# Patient Record
Sex: Male | Born: 1975 | Race: White | Hispanic: No | Marital: Single | Smoking: Current every day smoker
Health system: Southern US, Community
[De-identification: ages and names within clinical notes are randomized; demographics above are authoritative.]

## PROBLEM LIST (undated history)

## (undated) DIAGNOSIS — G40909 Epilepsy, unspecified, not intractable, without status epilepticus: Secondary | ICD-10-CM

---

## 2017-07-11 ENCOUNTER — Inpatient Hospital Stay (HOSPITAL_COMMUNITY): Payer: Medicare Other | Admitting: Certified Registered Nurse Anesthetist

## 2017-07-11 ENCOUNTER — Encounter (HOSPITAL_COMMUNITY): Admission: EM | Disposition: A | Discharge status: Home Health | Payer: Self-pay | Source: Home / Self Care

## 2017-07-11 ENCOUNTER — Inpatient Hospital Stay (HOSPITAL_COMMUNITY)
Admission: EM | Admit: 2017-07-11 | Discharge: 2017-07-18 | DRG: 493 | Disposition: A | Discharge status: Home Health | Payer: Medicare Other | Attending: Student | Admitting: Student

## 2017-07-11 ENCOUNTER — Emergency Department (HOSPITAL_COMMUNITY): Payer: Medicare Other

## 2017-07-11 ENCOUNTER — Encounter (HOSPITAL_COMMUNITY): Payer: Self-pay | Admitting: Radiology

## 2017-07-11 ENCOUNTER — Other Ambulatory Visit: Payer: Self-pay | Admitting: Orthopedic Surgery

## 2017-07-11 DIAGNOSIS — S82141A Displaced bicondylar fracture of right tibia, initial encounter for closed fracture: Secondary | ICD-10-CM | POA: Diagnosis present

## 2017-07-11 DIAGNOSIS — R509 Fever, unspecified: Secondary | ICD-10-CM | POA: Diagnosis present

## 2017-07-11 DIAGNOSIS — N179 Acute kidney failure, unspecified: Secondary | ICD-10-CM | POA: Diagnosis present

## 2017-07-11 DIAGNOSIS — Y92410 Unspecified street and highway as the place of occurrence of the external cause: Secondary | ICD-10-CM

## 2017-07-11 DIAGNOSIS — G40909 Epilepsy, unspecified, not intractable, without status epilepticus: Secondary | ICD-10-CM | POA: Diagnosis present

## 2017-07-11 DIAGNOSIS — S93402A Sprain of unspecified ligament of left ankle, initial encounter: Secondary | ICD-10-CM | POA: Diagnosis present

## 2017-07-11 DIAGNOSIS — T148XXA Other injury of unspecified body region, initial encounter: Secondary | ICD-10-CM

## 2017-07-11 DIAGNOSIS — T426X6A Underdosing of other antiepileptic and sedative-hypnotic drugs, initial encounter: Secondary | ICD-10-CM | POA: Diagnosis present

## 2017-07-11 DIAGNOSIS — F17210 Nicotine dependence, cigarettes, uncomplicated: Secondary | ICD-10-CM | POA: Diagnosis present

## 2017-07-11 DIAGNOSIS — S82891C Other fracture of right lower leg, initial encounter for open fracture type IIIA, IIIB, or IIIC: Secondary | ICD-10-CM

## 2017-07-11 DIAGNOSIS — I959 Hypotension, unspecified: Secondary | ICD-10-CM | POA: Diagnosis present

## 2017-07-11 DIAGNOSIS — S93431A Sprain of tibiofibular ligament of right ankle, initial encounter: Secondary | ICD-10-CM | POA: Diagnosis present

## 2017-07-11 DIAGNOSIS — E872 Acidosis: Secondary | ICD-10-CM | POA: Diagnosis present

## 2017-07-11 DIAGNOSIS — S82142A Displaced bicondylar fracture of left tibia, initial encounter for closed fracture: Secondary | ICD-10-CM

## 2017-07-11 DIAGNOSIS — S9304XA Dislocation of right ankle joint, initial encounter: Secondary | ICD-10-CM | POA: Diagnosis present

## 2017-07-11 DIAGNOSIS — R569 Unspecified convulsions: Secondary | ICD-10-CM

## 2017-07-11 DIAGNOSIS — S52612A Displaced fracture of left ulna styloid process, initial encounter for closed fracture: Secondary | ICD-10-CM | POA: Diagnosis present

## 2017-07-11 DIAGNOSIS — S91011A Laceration without foreign body, right ankle, initial encounter: Secondary | ICD-10-CM | POA: Diagnosis present

## 2017-07-11 DIAGNOSIS — S92101A Unspecified fracture of right talus, initial encounter for closed fracture: Secondary | ICD-10-CM | POA: Diagnosis present

## 2017-07-11 HISTORY — PX: EXTERNAL FIXATION LEG: SHX1549

## 2017-07-11 HISTORY — PX: I & D EXTREMITY: SHX5045

## 2017-07-11 HISTORY — DX: Epilepsy, unspecified, not intractable, without status epilepticus: G40.909

## 2017-07-11 LAB — CBC
HCT: 43.2 % (ref 39.0–52.0)
HEMOGLOBIN: 14.6 g/dL (ref 13.0–17.0)
MCH: 29.9 pg (ref 26.0–34.0)
MCHC: 33.8 g/dL (ref 30.0–36.0)
MCV: 88.3 fL (ref 78.0–100.0)
PLATELETS: 413 10*3/uL — AB (ref 150–400)
RBC: 4.89 MIL/uL (ref 4.22–5.81)
RDW: 12.7 % (ref 11.5–15.5)
WBC: 18.4 10*3/uL — AB (ref 4.0–10.5)

## 2017-07-11 LAB — PREPARE FRESH FROZEN PLASMA: Unit division: 0

## 2017-07-11 LAB — BPAM FFP
BLOOD PRODUCT EXPIRATION DATE: 201906222359
Blood Product Expiration Date: 201906222359
ISSUE DATE / TIME: 201906191500
ISSUE DATE / TIME: 201906191500
UNIT TYPE AND RH: 6200
Unit Type and Rh: 6200

## 2017-07-11 LAB — I-STAT CHEM 8, ED
BUN: 8 mg/dL (ref 6–20)
CALCIUM ION: 1.12 mmol/L — AB (ref 1.15–1.40)
CREATININE: 1.2 mg/dL (ref 0.61–1.24)
Chloride: 106 mmol/L (ref 101–111)
GLUCOSE: 133 mg/dL — AB (ref 65–99)
HCT: 42 % (ref 39.0–52.0)
HEMOGLOBIN: 14.3 g/dL (ref 13.0–17.0)
Potassium: 3.5 mmol/L (ref 3.5–5.1)
Sodium: 139 mmol/L (ref 135–145)
TCO2: 18 mmol/L — ABNORMAL LOW (ref 22–32)

## 2017-07-11 LAB — PROTIME-INR
INR: 0.97
PROTHROMBIN TIME: 12.8 s (ref 11.4–15.2)

## 2017-07-11 LAB — COMPREHENSIVE METABOLIC PANEL
ALT: 25 U/L (ref 17–63)
ANION GAP: 14 (ref 5–15)
AST: 27 U/L (ref 15–41)
Albumin: 3.7 g/dL (ref 3.5–5.0)
Alkaline Phosphatase: 67 U/L (ref 38–126)
BUN: 9 mg/dL (ref 6–20)
CALCIUM: 8.7 mg/dL — AB (ref 8.9–10.3)
CHLORIDE: 107 mmol/L (ref 101–111)
CO2: 17 mmol/L — AB (ref 22–32)
Creatinine, Ser: 1.37 mg/dL — ABNORMAL HIGH (ref 0.61–1.24)
Glucose, Bld: 138 mg/dL — ABNORMAL HIGH (ref 65–99)
Potassium: 3.3 mmol/L — ABNORMAL LOW (ref 3.5–5.1)
SODIUM: 138 mmol/L (ref 135–145)
Total Bilirubin: 0.6 mg/dL (ref 0.3–1.2)
Total Protein: 6.1 g/dL — ABNORMAL LOW (ref 6.5–8.1)

## 2017-07-11 LAB — ETHANOL: Alcohol, Ethyl (B): <10 mg/dL (ref <10)

## 2017-07-11 LAB — ABO/RH: ABO/RH(D): O NEG

## 2017-07-11 LAB — I-STAT CG4 LACTIC ACID, ED: Lactic Acid, Venous: 8.8 mmol/L (ref 0.5–1.9)

## 2017-07-11 LAB — CDS SEROLOGY

## 2017-07-11 LAB — CBG MONITORING, ED: GLUCOSE-CAPILLARY: 139 mg/dL — AB (ref 65–99)

## 2017-07-11 SURGERY — IRRIGATION AND DEBRIDEMENT EXTREMITY
Anesthesia: General | Site: Ankle | Laterality: Right

## 2017-07-11 MED ORDER — LIDOCAINE 2% (20 MG/ML) 5 ML SYRINGE
INTRAMUSCULAR | Status: AC
  Filled 2017-07-11: qty 10

## 2017-07-11 MED ORDER — PROPOFOL 10 MG/ML IV BOLUS
INTRAVENOUS | Status: DC | PRN
  Administered 2017-07-11: 170 mg via INTRAVENOUS
  Administered 2017-07-11: 30 mg via INTRAVENOUS

## 2017-07-11 MED ORDER — OXYCODONE HCL 5 MG PO TABS: 5.0000 mg | ORAL_TABLET | ORAL | Status: DC | PRN

## 2017-07-11 MED ORDER — ACETAMINOPHEN 325 MG PO TABS: 325.0000 mg | ORAL_TABLET | Freq: Four times a day (QID) | ORAL | Status: DC | PRN

## 2017-07-11 MED ORDER — SUGAMMADEX SODIUM 200 MG/2ML IV SOLN
INTRAVENOUS | Status: DC | PRN
  Administered 2017-07-11: 200 mg via INTRAVENOUS

## 2017-07-11 MED ORDER — ROCURONIUM BROMIDE 50 MG/5ML IV SOLN
INTRAVENOUS | Status: AC
  Filled 2017-07-11: qty 2

## 2017-07-11 MED ORDER — FENTANYL CITRATE (PF) 250 MCG/5ML IJ SOLN
INTRAMUSCULAR | Status: AC
  Filled 2017-07-11: qty 5

## 2017-07-11 MED ORDER — PHENYLEPHRINE 40 MCG/ML (10ML) SYRINGE FOR IV PUSH (FOR BLOOD PRESSURE SUPPORT)
PREFILLED_SYRINGE | INTRAVENOUS | Status: AC
  Filled 2017-07-11: qty 10

## 2017-07-11 MED ORDER — IOPAMIDOL (ISOVUE-370) INJECTION 76%
100.0000 mL | Freq: Once | INTRAVENOUS | Status: AC | PRN
  Administered 2017-07-11: 100 mL via INTRAVENOUS

## 2017-07-11 MED ORDER — LEVETIRACETAM IN NACL 1500 MG/100ML IV SOLN
1500.0000 mg | Freq: Two times a day (BID) | INTRAVENOUS | Status: DC
  Administered 2017-07-12 – 2017-07-13 (×3): 1500 mg via INTRAVENOUS
  Filled 2017-07-11 (×3): qty 100

## 2017-07-11 MED ORDER — SODIUM CHLORIDE 0.9 % IR SOLN
Status: DC | PRN
  Administered 2017-07-11: 6000 mL

## 2017-07-11 MED ORDER — ONDANSETRON HCL 4 MG/2ML IJ SOLN
INTRAMUSCULAR | Status: DC | PRN
  Administered 2017-07-11: 4 mg via INTRAVENOUS

## 2017-07-11 MED ORDER — ONDANSETRON HCL 4 MG/2ML IJ SOLN
4.0000 mg | Freq: Four times a day (QID) | INTRAMUSCULAR | Status: DC | PRN
  Administered 2017-07-16: 4 mg via INTRAVENOUS
  Filled 2017-07-11: qty 2

## 2017-07-11 MED ORDER — HYDROMORPHONE HCL 1 MG/ML IJ SOLN
INTRAMUSCULAR | Status: AC
  Administered 2017-07-11: 23:00:00
  Filled 2017-07-11: qty 1

## 2017-07-11 MED ORDER — MIDAZOLAM HCL 2 MG/2ML IJ SOLN
INTRAMUSCULAR | Status: AC
  Filled 2017-07-11: qty 2

## 2017-07-11 MED ORDER — FENTANYL CITRATE (PF) 100 MCG/2ML IJ SOLN
INTRAMUSCULAR | Status: DC | PRN
  Administered 2017-07-11 (×2): 50 ug via INTRAVENOUS
  Administered 2017-07-11: 100 ug via INTRAVENOUS
  Administered 2017-07-11 (×4): 50 ug via INTRAVENOUS
  Administered 2017-07-11: 100 ug via INTRAVENOUS

## 2017-07-11 MED ORDER — KETAMINE HCL 10 MG/ML IJ SOLN
INTRAMUSCULAR | Status: AC
  Filled 2017-07-11: qty 1

## 2017-07-11 MED ORDER — ONDANSETRON HCL 4 MG/2ML IJ SOLN: 4.0000 mg | Freq: Four times a day (QID) | INTRAMUSCULAR | Status: DC | PRN

## 2017-07-11 MED ORDER — OXYCODONE HCL 5 MG PO TABS: 10.0000 mg | ORAL_TABLET | ORAL | Status: DC | PRN

## 2017-07-11 MED ORDER — LEVETIRACETAM IN NACL 1000 MG/100ML IV SOLN: 1000.0000 mg | Freq: Once | INTRAVENOUS | Status: DC

## 2017-07-11 MED ORDER — ONDANSETRON HCL 4 MG PO TABS: 4.0000 mg | ORAL_TABLET | Freq: Four times a day (QID) | ORAL | Status: DC | PRN

## 2017-07-11 MED ORDER — PHENYLEPHRINE 40 MCG/ML (10ML) SYRINGE FOR IV PUSH (FOR BLOOD PRESSURE SUPPORT)
PREFILLED_SYRINGE | INTRAVENOUS | Status: AC
  Filled 2017-07-11: qty 20

## 2017-07-11 MED ORDER — METHOCARBAMOL 1000 MG/10ML IJ SOLN: 500.0000 mg | Freq: Four times a day (QID) | INTRAVENOUS | Status: DC | PRN

## 2017-07-11 MED ORDER — ROCURONIUM BROMIDE 100 MG/10ML IV SOLN
INTRAVENOUS | Status: DC | PRN
  Administered 2017-07-11: 50 mg via INTRAVENOUS

## 2017-07-11 MED ORDER — FAMOTIDINE 20 MG PO TABS
20.0000 mg | ORAL_TABLET | Freq: Every day | ORAL | Status: DC
  Administered 2017-07-12 – 2017-07-18 (×6): 20 mg via ORAL
  Filled 2017-07-11 (×6): qty 1

## 2017-07-11 MED ORDER — DOCUSATE SODIUM 100 MG PO CAPS: 100.0000 mg | ORAL_CAPSULE | Freq: Two times a day (BID) | ORAL | Status: DC

## 2017-07-11 MED ORDER — LEVETIRACETAM IN NACL 1000 MG/100ML IV SOLN: 1000.0000 mg | INTRAVENOUS | Status: DC

## 2017-07-11 MED ORDER — METHOCARBAMOL 500 MG PO TABS
500.0000 mg | ORAL_TABLET | Freq: Four times a day (QID) | ORAL | Status: DC | PRN
  Administered 2017-07-12 – 2017-07-17 (×7): 500 mg via ORAL
  Filled 2017-07-11 (×10): qty 1

## 2017-07-11 MED ORDER — ACETAMINOPHEN 325 MG PO TABS: 650.0000 mg | ORAL_TABLET | Freq: Four times a day (QID) | ORAL | Status: DC

## 2017-07-11 MED ORDER — SUCCINYLCHOLINE CHLORIDE 20 MG/ML IJ SOLN
INTRAMUSCULAR | Status: DC | PRN
  Administered 2017-07-11: 120 mg via INTRAVENOUS

## 2017-07-11 MED ORDER — IOPAMIDOL (ISOVUE-370) INJECTION 76%
INTRAVENOUS | Status: AC
  Filled 2017-07-11: qty 100

## 2017-07-11 MED ORDER — DOCUSATE SODIUM 100 MG PO CAPS
100.0000 mg | ORAL_CAPSULE | Freq: Two times a day (BID) | ORAL | Status: DC
  Administered 2017-07-12 – 2017-07-18 (×12): 100 mg via ORAL
  Filled 2017-07-11 (×12): qty 1

## 2017-07-11 MED ORDER — KETAMINE HCL 10 MG/ML IJ SOLN
INTRAMUSCULAR | Status: AC | PRN
  Administered 2017-07-11: 90 mg via INTRAVENOUS

## 2017-07-11 MED ORDER — HYDROMORPHONE HCL 1 MG/ML IJ SOLN: 0.2500 mg | INTRAMUSCULAR | Status: DC | PRN

## 2017-07-11 MED ORDER — ONDANSETRON HCL 4 MG/2ML IJ SOLN: 4.0000 mg | Freq: Once | INTRAMUSCULAR | Status: DC | PRN

## 2017-07-11 MED ORDER — LAMOTRIGINE 150 MG PO TABS
300.0000 mg | ORAL_TABLET | Freq: Two times a day (BID) | ORAL | Status: DC
  Administered 2017-07-12 – 2017-07-18 (×12): 300 mg via ORAL
  Filled 2017-07-11 (×14): qty 2

## 2017-07-11 MED ORDER — CEFAZOLIN SODIUM-DEXTROSE 2-4 GM/100ML-% IV SOLN
2.0000 g | Freq: Three times a day (TID) | INTRAVENOUS | Status: DC
  Administered 2017-07-11 – 2017-07-16 (×15): 2 g via INTRAVENOUS
  Filled 2017-07-11 (×15): qty 100

## 2017-07-11 MED ORDER — MORPHINE SULFATE (PF) 4 MG/ML IV SOLN
2.0000 mg | INTRAVENOUS | Status: DC | PRN
  Administered 2017-07-13: 2 mg via INTRAVENOUS
  Administered 2017-07-16 – 2017-07-17 (×2): 4 mg via INTRAVENOUS
  Filled 2017-07-11 (×3): qty 1

## 2017-07-11 MED ORDER — ENOXAPARIN SODIUM 40 MG/0.4ML ~~LOC~~ SOLN
40.0000 mg | SUBCUTANEOUS | Status: DC
  Administered 2017-07-12 – 2017-07-18 (×6): 40 mg via SUBCUTANEOUS
  Filled 2017-07-11 (×6): qty 0.4

## 2017-07-11 MED ORDER — HYDRALAZINE HCL 20 MG/ML IJ SOLN: 10.0000 mg | INTRAMUSCULAR | Status: DC | PRN

## 2017-07-11 MED ORDER — LIDOCAINE HCL (CARDIAC) PF 100 MG/5ML IV SOSY
PREFILLED_SYRINGE | INTRAVENOUS | Status: DC | PRN
  Administered 2017-07-11: 100 mg via INTRAVENOUS

## 2017-07-11 MED ORDER — POTASSIUM CHLORIDE IN NACL 20-0.9 MEQ/L-% IV SOLN
INTRAVENOUS | Status: DC
  Filled 2017-07-11: qty 1000

## 2017-07-11 MED ORDER — SODIUM CHLORIDE 0.9 % IV SOLN
2000.0000 mg | INTRAVENOUS | Status: AC
  Administered 2017-07-11: 2000 mg via INTRAVENOUS
  Filled 2017-07-11 (×2): qty 20

## 2017-07-11 MED ORDER — ONDANSETRON 4 MG PO TBDP: 4.0000 mg | ORAL_TABLET | Freq: Four times a day (QID) | ORAL | Status: DC | PRN

## 2017-07-11 MED ORDER — DEXMEDETOMIDINE HCL 200 MCG/2ML IV SOLN
INTRAVENOUS | Status: DC | PRN
  Administered 2017-07-11: 20 ug via INTRAVENOUS

## 2017-07-11 MED ORDER — SODIUM CHLORIDE 0.9 % IV SOLN
2000.0000 mg | INTRAVENOUS | Status: AC
  Administered 2017-07-11: 2000 mg via INTRAVENOUS
  Filled 2017-07-11: qty 20

## 2017-07-11 MED ORDER — SENNA 8.6 MG PO TABS
1.0000 | ORAL_TABLET | Freq: Two times a day (BID) | ORAL | Status: DC
  Administered 2017-07-12 – 2017-07-18 (×13): 8.6 mg via ORAL
  Filled 2017-07-11 (×13): qty 1

## 2017-07-11 MED ORDER — MIDAZOLAM HCL 5 MG/5ML IJ SOLN
INTRAMUSCULAR | Status: DC | PRN
  Administered 2017-07-11 (×2): 1 mg via INTRAVENOUS

## 2017-07-11 MED ORDER — PROPOFOL 10 MG/ML IV BOLUS
INTRAVENOUS | Status: AC
  Filled 2017-07-11: qty 20

## 2017-07-11 MED ORDER — DEXAMETHASONE SODIUM PHOSPHATE 10 MG/ML IJ SOLN
INTRAMUSCULAR | Status: DC | PRN
  Administered 2017-07-11: 10 mg via INTRAVENOUS

## 2017-07-11 MED ORDER — PHENYLEPHRINE HCL 10 MG/ML IJ SOLN
INTRAMUSCULAR | Status: DC | PRN
  Administered 2017-07-11: 160 ug via INTRAVENOUS
  Administered 2017-07-11 (×2): 80 ug via INTRAVENOUS
  Administered 2017-07-11: 120 ug via INTRAVENOUS

## 2017-07-11 MED ORDER — LACTATED RINGERS IV SOLN
INTRAVENOUS | Status: DC | PRN
  Administered 2017-07-11 (×2): via INTRAVENOUS

## 2017-07-11 MED ORDER — 0.9 % SODIUM CHLORIDE (POUR BTL) OPTIME
TOPICAL | Status: DC | PRN
  Administered 2017-07-11: 1000 mL

## 2017-07-11 MED ORDER — OXYCODONE HCL 5 MG PO TABS
5.0000 mg | ORAL_TABLET | ORAL | Status: DC | PRN
  Administered 2017-07-12: 10 mg via ORAL
  Filled 2017-07-11: qty 2

## 2017-07-11 MED ORDER — DIPHENHYDRAMINE HCL 12.5 MG/5ML PO ELIX: 12.5000 mg | ORAL_SOLUTION | ORAL | Status: DC | PRN

## 2017-07-11 MED ORDER — SODIUM CHLORIDE 0.9 % IV BOLUS
3000.0000 mL | Freq: Once | INTRAVENOUS | Status: AC
  Administered 2017-07-11: 3000 mL via INTRAVENOUS

## 2017-07-11 MED ORDER — ACETAMINOPHEN 325 MG PO TABS: 650.0000 mg | ORAL_TABLET | ORAL | Status: DC | PRN

## 2017-07-11 MED ORDER — ONDANSETRON HCL 4 MG/2ML IJ SOLN
INTRAMUSCULAR | Status: AC
  Filled 2017-07-11: qty 6

## 2017-07-11 MED ORDER — SODIUM CHLORIDE 0.9 % IV SOLN
INTRAVENOUS | Status: DC
  Administered 2017-07-11 – 2017-07-12 (×2): via INTRAVENOUS

## 2017-07-11 MED ORDER — DEXAMETHASONE SODIUM PHOSPHATE 10 MG/ML IJ SOLN
INTRAMUSCULAR | Status: AC
  Filled 2017-07-11: qty 5

## 2017-07-11 SURGICAL SUPPLY — 75 items
ANKLE SYNDEMOSIS ZIPTIGHT (Ankle) ×3 IMPLANT
BANDAGE ACE 4X5 VEL STRL LF (GAUZE/BANDAGES/DRESSINGS) ×2 IMPLANT
BANDAGE ELASTIC 6 VELCRO ST LF (GAUZE/BANDAGES/DRESSINGS) ×2 IMPLANT
BANDAGE ESMARK 6X9 LF (GAUZE/BANDAGES/DRESSINGS) ×1 IMPLANT
BAR GLASS FIBER EXFX 11X500 (EXFIX) ×4 IMPLANT
BIT DRILL CANN MED FLUTE 4.0 (BIT) IMPLANT
BLADE SURG 10 STRL SS (BLADE) ×3 IMPLANT
BNDG CONFORM 3 STRL LF (GAUZE/BANDAGES/DRESSINGS) ×1 IMPLANT
BNDG ESMARK 6X9 LF (GAUZE/BANDAGES/DRESSINGS) ×3
BNDG GAUZE ELAST 4 BULKY (GAUZE/BANDAGES/DRESSINGS) ×4 IMPLANT
CANISTER SUCT 3000ML PPV (MISCELLANEOUS) ×3 IMPLANT
CHLORAPREP W/TINT 26ML (MISCELLANEOUS) ×1 IMPLANT
COVER SURGICAL LIGHT HANDLE (MISCELLANEOUS) ×3 IMPLANT
CUFF TOURNIQUET SINGLE 34IN LL (TOURNIQUET CUFF) ×3 IMPLANT
CUFF TOURNIQUET SINGLE 44IN (TOURNIQUET CUFF) IMPLANT
DEVICE FIXATION W/ZIPLOOP (Orthopedic Implant) ×2 IMPLANT
DRAPE C-ARM 42X72 X-RAY (DRAPES) ×1 IMPLANT
DRAPE U-SHAPE 47X51 STRL (DRAPES) ×3 IMPLANT
DRILL CANN 4.0MM (BIT) ×3
DRSG ADAPTIC 3X8 NADH LF (GAUZE/BANDAGES/DRESSINGS) IMPLANT
DRSG MEPILEX BORDER 4X8 (GAUZE/BANDAGES/DRESSINGS) ×2 IMPLANT
DRSG MEPITEL 4X7.2 (GAUZE/BANDAGES/DRESSINGS) ×1 IMPLANT
DRSG PAD ABDOMINAL 8X10 ST (GAUZE/BANDAGES/DRESSINGS) ×4 IMPLANT
ELECT REM PT RETURN 9FT ADLT (ELECTROSURGICAL) ×3
ELECTRODE REM PT RTRN 9FT ADLT (ELECTROSURGICAL) ×1 IMPLANT
EVACUATOR SILICONE 100CC (DRAIN) IMPLANT
GAUZE SPONGE 4X4 12PLY STRL (GAUZE/BANDAGES/DRESSINGS) IMPLANT
GLOVE BIO SURGEON STRL SZ8 (GLOVE) ×3 IMPLANT
GLOVE BIOGEL PI IND STRL 8 (GLOVE) ×2 IMPLANT
GLOVE BIOGEL PI INDICATOR 8 (GLOVE) ×4
GLOVE ECLIPSE 8.0 STRL XLNG CF (GLOVE) ×3 IMPLANT
GOWN STRL REUS W/ TWL LRG LVL3 (GOWN DISPOSABLE) ×1 IMPLANT
GOWN STRL REUS W/ TWL XL LVL3 (GOWN DISPOSABLE) ×2 IMPLANT
GOWN STRL REUS W/TWL LRG LVL3 (GOWN DISPOSABLE) ×2
GOWN STRL REUS W/TWL XL LVL3 (GOWN DISPOSABLE) ×4
HALF PIN 5.0X160 (EXFIX) ×4 IMPLANT
KIT BASIN OR (CUSTOM PROCEDURE TRAY) ×3 IMPLANT
KIT TURNOVER KIT B (KITS) ×3 IMPLANT
NEEDLE 22X1 1/2 (OR ONLY) (NEEDLE) IMPLANT
NS IRRIG 1000ML POUR BTL (IV SOLUTION) ×3 IMPLANT
PACK ORTHO EXTREMITY (CUSTOM PROCEDURE TRAY) ×3 IMPLANT
PAD ARMBOARD 7.5X6 YLW CONV (MISCELLANEOUS) ×6 IMPLANT
PAD CAST 4YDX4 CTTN HI CHSV (CAST SUPPLIES) ×1 IMPLANT
PADDING CAST COTTON 4X4 STRL (CAST SUPPLIES) ×2
PIN CLAMP 2BAR 75MM BLUE (EXFIX) ×4 IMPLANT
PIN HALF YELLOW 5X160X35 (EXFIX) ×4 IMPLANT
PLATE SMALL FRAG 3.5X49 4H (Plate) ×2 IMPLANT
SET CYSTO W/LG BORE CLAMP LF (SET/KITS/TRAYS/PACK) ×3 IMPLANT
SOAP 2 % CHG 4 OZ (WOUND CARE) ×3 IMPLANT
SPLINT PLASTER CAST XFAST 5X30 (CAST SUPPLIES) IMPLANT
SPLINT PLASTER XFAST SET 5X30 (CAST SUPPLIES) ×2
SPONGE LAP 18X18 X RAY DECT (DISPOSABLE) ×2 IMPLANT
SPONGE LAP 4X18 RFD (DISPOSABLE) ×3 IMPLANT
STAPLER VISISTAT 35W (STAPLE) IMPLANT
SUCTION FRAZIER HANDLE 10FR (MISCELLANEOUS) ×2
SUCTION TUBE FRAZIER 10FR DISP (MISCELLANEOUS) ×1 IMPLANT
SUT ETHILON 2 0 FS 18 (SUTURE) ×4 IMPLANT
SUT ETHILON 3 0 PS 1 (SUTURE) ×4 IMPLANT
SUT PDS AB 0 CT 36 (SUTURE) ×4 IMPLANT
SUT PROLENE 3 0 PS 2 (SUTURE) ×1 IMPLANT
SUT VIC AB 2-0 CT1 27 (SUTURE) ×4
SUT VIC AB 2-0 CT1 TAPERPNT 27 (SUTURE) IMPLANT
SUT VIC AB 3-0 PS2 18 (SUTURE)
SUT VIC AB 3-0 PS2 18XBRD (SUTURE) ×1 IMPLANT
SYR CONTROL 10ML LL (SYRINGE) IMPLANT
SYSTEM FIXATN ANKL SYNDESMOSIS (Ankle) IMPLANT
TOWEL OR 17X24 6PK STRL BLUE (TOWEL DISPOSABLE) ×3 IMPLANT
TOWEL OR 17X26 10 PK STRL BLUE (TOWEL DISPOSABLE) ×3 IMPLANT
TRAY FOL W/BAG SLVR 16FR STRL (SET/KITS/TRAYS/PACK) IMPLANT
TRAY FOLEY W/BAG SLVR 16FR LF (SET/KITS/TRAYS/PACK) ×2
TUBE CONNECTING 12'X1/4 (SUCTIONS) ×1
TUBE CONNECTING 12X1/4 (SUCTIONS) ×2 IMPLANT
TUBING CYSTO DISP (UROLOGICAL SUPPLIES) ×3 IMPLANT
WATER STERILE IRR 1000ML POUR (IV SOLUTION) ×3 IMPLANT
YANKAUER SUCT BULB TIP NO VENT (SUCTIONS) ×3 IMPLANT

## 2017-07-11 NOTE — ED Notes (Signed)
pts shirt was cut off by ems and discarded .  Shorts belt socks and one shoe  Was placed in a bag and given to the pts girlfirend with his brother at the bedside.  The pts wallet  Was givren to the pts girlfirend by dr Madilyn Hookrees  The brother is ware and ok with that

## 2017-07-11 NOTE — ED Notes (Signed)
Report given to Chris C RN

## 2017-07-11 NOTE — Anesthesia Preprocedure Evaluation (Addendum)
Anesthesia Evaluation  Patient identified by MRN, date of birth, ID band Patient awake    Reviewed: Allergy & Precautions, NPO status , Patient's Chart, lab work & pertinent test results  Airway Mallampati: III  TM Distance: >3 FB Neck ROM: Limited    Dental  (+) Dental Advisory Given   Pulmonary neg pulmonary ROS,    breath sounds clear to auscultation       Cardiovascular negative cardio ROS   Rhythm:Regular Rate:Normal     Neuro/Psych Seizures -,  C-collar precautions negative neurological ROS     GI/Hepatic negative GI ROS, Neg liver ROS,   Endo/Other  negative endocrine ROS  Renal/GU negative Renal ROS     Musculoskeletal Open right ankle fx   Abdominal   Peds  Hematology   Anesthesia Other Findings   Reproductive/Obstetrics                           Lab Results  Component Value Date   WBC 18.4 (H) 07/11/2017   HGB 14.3 07/11/2017   HCT 42.0 07/11/2017   MCV 88.3 07/11/2017   PLT 413 (H) 07/11/2017   Lab Results  Component Value Date   CREATININE 1.20 07/11/2017   BUN 8 07/11/2017   NA 139 07/11/2017   K 3.5 07/11/2017   CL 106 07/11/2017   CO2 17 (L) 07/11/2017    Anesthesia Physical Anesthesia Plan  ASA: III and emergent  Anesthesia Plan: General   Post-op Pain Management:    Induction: Intravenous and Rapid sequence  PONV Risk Score and Plan: 2 and Ondansetron, Dexamethasone and Treatment may vary due to age or medical condition  Airway Management Planned: Oral ETT and Video Laryngoscope Planned  Additional Equipment:   Intra-op Plan:   Post-operative Plan: Extubation in OR  Informed Consent: I have reviewed the patients History and Physical, chart, labs and discussed the procedure including the risks, benefits and alternatives for the proposed anesthesia with the patient or authorized representative who has indicated his/her understanding and acceptance.    Dental advisory given  Plan Discussed with:   Anesthesia Plan Comments:        Anesthesia Quick Evaluation

## 2017-07-11 NOTE — Anesthesia Procedure Notes (Signed)
Procedure Name: Intubation Date/Time: 07/11/2017 7:17 PM Performed by: Adonis Housekeeperongell, Kalisa Girtman M, CRNA Pre-anesthesia Checklist: Patient identified, Emergency Drugs available, Suction available and Patient being monitored Patient Re-evaluated:Patient Re-evaluated prior to induction Oxygen Delivery Method: Circle system utilized Preoxygenation: Pre-oxygenation with 100% oxygen Induction Type: IV induction, Rapid sequence and Cricoid Pressure applied Laryngoscope Size: Glidescope and 4 Grade View: Grade I Tube type: Oral Tube size: 7.5 mm Number of attempts: 1 Airway Equipment and Method: Rigid stylet and Video-laryngoscopy Placement Confirmation: ETT inserted through vocal cords under direct vision,  positive ETCO2 and breath sounds checked- equal and bilateral Secured at: 25 cm Tube secured with: Tape Dental Injury: Teeth and Oropharynx as per pre-operative assessment

## 2017-07-11 NOTE — ED Notes (Signed)
Preparing to emergently do conscious sedation to set right lower ankle.
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
## 2017-07-11 NOTE — Sedation Documentation (Signed)
Ankle reduced and positive pulses obtained.

## 2017-07-11 NOTE — ED Notes (Signed)
Pt went to c-t and just returned to the room.  Alert oriented skin warm and dry.  C/o pain in hnis rt leg still
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
## 2017-07-11 NOTE — ED Notes (Signed)
Family remains at the bedside.  Oral meds not given at present  Waiting on the ortho doctor for poss surgeyr

## 2017-07-11 NOTE — ED Notes (Signed)
keppra attempted but the attachment was not tight enough  Most of iv med emptied out  Replacement needed  Pharmacist will replace it

## 2017-07-11 NOTE — ED Notes (Signed)
Or permit signed by the pt   Ready for or

## 2017-07-11 NOTE — ED Notes (Signed)
Dr hewitt at the beside
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
## 2017-07-11 NOTE — Transfer of Care (Signed)
Immediate Anesthesia Transfer of Care Note  Patient: Chase Morris  Procedure(s) Performed: IRRIGATION AND DEBRIDEMENT OPEN FRACTURE RIGHT ANKLE OPEN REDUCTION INTERNAL FIXATION (Right Ankle) EXTERNAL FIXATION RIGHT TIBIAL PLATEAU (Right Ankle)  Patient Location: PACU  Anesthesia Type:General  Level of Consciousness: awake  - agitated Airway & Oxygen Therapy: Patient Spontanous Breathing and Patient connected to face mask oxygen  Post-op Assessment: Report given to RN and Post -op Vital signs reviewed and stable  Post vital signs: Reviewed and stable  Last Vitals:  Vitals Value Taken Time  BP 136/94 07/11/2017  9:37 PM  Temp    Pulse 102 07/11/2017  9:40 PM  Resp 17 07/11/2017  9:40 PM  SpO2 94 % 07/11/2017  9:40 PM  Vitals shown include unvalidated device data.  Last Pain:  Vitals:   07/11/17 1816  PainSc: 10-Worst pain ever         Complications: No apparent anesthesia complications

## 2017-07-11 NOTE — ED Notes (Signed)
Dr Fredricka Bonineconnor back to see  She does not know the name iof the ortho doctor seeing the pt

## 2017-07-11 NOTE — Progress Notes (Signed)
Patient groggy but arouses to voice.  Denies pain.  Oriented x 4.  I asked patient if it was okay to share information with his significant other, Margaretha GlassingLoretta.  He stated that it was okay to update her.  Interaction witnessed by Zeb ComfortSteve Shaw RN.

## 2017-07-11 NOTE — ED Provider Notes (Signed)
  [facility]   CSN: [id number] Arrival date & time: 07/11/17  1446         Jacalyn LefevreHaviland, Rionna Feltes, MD 07/11/17 1529
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
## 2017-07-12 ENCOUNTER — Inpatient Hospital Stay (HOSPITAL_COMMUNITY): Payer: Medicare Other

## 2017-07-12 ENCOUNTER — Other Ambulatory Visit: Payer: Self-pay

## 2017-07-12 ENCOUNTER — Encounter (HOSPITAL_COMMUNITY): Payer: Self-pay | Admitting: Student

## 2017-07-12 DIAGNOSIS — R569 Unspecified convulsions: Secondary | ICD-10-CM

## 2017-07-12 LAB — CBC
HCT: 40.6 % (ref 39.0–52.0)
Hemoglobin: 13.6 g/dL (ref 13.0–17.0)
MCH: 29.6 pg (ref 26.0–34.0)
MCHC: 33.5 g/dL (ref 30.0–36.0)
MCV: 88.5 fL (ref 78.0–100.0)
PLATELETS: 316 10*3/uL (ref 150–400)
RBC: 4.59 MIL/uL (ref 4.22–5.81)
RDW: 12.9 % (ref 11.5–15.5)
WBC: 18.4 10*3/uL — ABNORMAL HIGH (ref 4.0–10.5)

## 2017-07-12 LAB — URINALYSIS, ROUTINE W REFLEX MICROSCOPIC
BILIRUBIN URINE: NEGATIVE
Glucose, UA: NEGATIVE mg/dL
KETONES UR: NEGATIVE mg/dL
NITRITE: NEGATIVE
PH: 6 (ref 5.0–8.0)
Protein, ur: NEGATIVE mg/dL
Specific Gravity, Urine: 1.009 (ref 1.005–1.030)

## 2017-07-12 LAB — RAPID URINE DRUG SCREEN, HOSP PERFORMED
AMPHETAMINES: NOT DETECTED
Benzodiazepines: POSITIVE — AB
Cocaine: NOT DETECTED
Opiates: NOT DETECTED
Tetrahydrocannabinol: NOT DETECTED

## 2017-07-12 LAB — TYPE AND SCREEN
ABO/RH(D): O NEG
ANTIBODY SCREEN: NEGATIVE
UNIT DIVISION: 0
Unit division: 0

## 2017-07-12 LAB — LACTIC ACID, PLASMA
LACTIC ACID, VENOUS: 3.1 mmol/L — AB (ref 0.5–1.9)
Lactic Acid, Venous: 2.7 mmol/L (ref 0.5–1.9)

## 2017-07-12 LAB — BASIC METABOLIC PANEL
Anion gap: 7 (ref 5–15)
BUN: 6 mg/dL (ref 6–20)
CALCIUM: 8.2 mg/dL — AB (ref 8.9–10.3)
CO2: 25 mmol/L (ref 22–32)
CREATININE: 1.19 mg/dL (ref 0.61–1.24)
Chloride: 107 mmol/L (ref 101–111)
GFR calc non Af Amer: >60 mL/min (ref >60)
GLUCOSE: 148 mg/dL — AB (ref 65–99)
Potassium: 4.1 mmol/L (ref 3.5–5.1)
Sodium: 139 mmol/L (ref 135–145)

## 2017-07-12 LAB — BPAM RBC
Blood Product Expiration Date: 201906272359
Blood Product Expiration Date: 201906272359
ISSUE DATE / TIME: 201906200626
ISSUE DATE / TIME: 201906200129
UNIT TYPE AND RH: 9500
Unit Type and Rh: 9500

## 2017-07-12 LAB — HIV ANTIBODY (ROUTINE TESTING W REFLEX): HIV Screen 4th Generation wRfx: NONREACTIVE

## 2017-07-12 LAB — MRSA PCR SCREENING: MRSA BY PCR: NEGATIVE

## 2017-07-12 LAB — BLOOD PRODUCT ORDER (VERBAL) VERIFICATION

## 2017-07-12 MED ORDER — ACETAMINOPHEN 325 MG PO TABS
650.0000 mg | ORAL_TABLET | Freq: Four times a day (QID) | ORAL | Status: DC
  Administered 2017-07-12 – 2017-07-14 (×8): 650 mg via ORAL
  Filled 2017-07-12 (×9): qty 2

## 2017-07-12 MED ORDER — SODIUM CHLORIDE 0.9 % IV SOLN
INTRAVENOUS | Status: DC
  Administered 2017-07-12: 06:00:00 via INTRAVENOUS

## 2017-07-12 MED ORDER — AMITRIPTYLINE HCL 25 MG PO TABS
75.0000 mg | ORAL_TABLET | Freq: Every day | ORAL | Status: DC
  Administered 2017-07-12 – 2017-07-17 (×6): 75 mg via ORAL
  Filled 2017-07-12 (×6): qty 3

## 2017-07-12 MED ORDER — OXYCODONE HCL 5 MG PO TABS
5.0000 mg | ORAL_TABLET | ORAL | Status: DC | PRN
  Administered 2017-07-12: 5 mg via ORAL
  Administered 2017-07-12 (×2): 10 mg via ORAL
  Administered 2017-07-13: 5 mg via ORAL
  Administered 2017-07-13 – 2017-07-17 (×10): 10 mg via ORAL
  Filled 2017-07-12 (×15): qty 2

## 2017-07-12 MED ORDER — SODIUM CHLORIDE 0.9 % IV BOLUS
1000.0000 mL | Freq: Once | INTRAVENOUS | Status: AC
  Administered 2017-07-12: 1000 mL via INTRAVENOUS

## 2017-07-12 NOTE — Anesthesia Postprocedure Evaluation (Signed)
Anesthesia Post Note  Patient: Tyler PitaWilliam S XXXCasey  Procedure(s) Performed: IRRIGATION AND DEBRIDEMENT OPEN FRACTURE RIGHT ANKLE OPEN REDUCTION INTERNAL FIXATION (Right Ankle) EXTERNAL FIXATION RIGHT TIBIAL PLATEAU (Right Ankle)     Patient location during evaluation: PACU Anesthesia Type: General Level of consciousness: awake and alert Pain management: pain level controlled Vital Signs Assessment: post-procedure vital signs reviewed and stable Respiratory status: spontaneous breathing, nonlabored ventilation, respiratory function stable and patient connected to nasal cannula oxygen Cardiovascular status: blood pressure returned to baseline and stable Postop Assessment: no apparent nausea or vomiting Anesthetic complications: no    Last Vitals:  Vitals:   07/11/17 2230 07/11/17 2300  BP: (!) 131/91   Pulse: 89   Resp: 20   Temp:  37.2 C  SpO2: 97%     Last Pain:  Vitals:   07/11/17 2304  TempSrc:   PainSc: 0-No pain                 Kennieth RadFitzgerald, Elonzo Sopp E
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
## 2017-07-12 NOTE — Progress Notes (Signed)
CRITICAL VALUE ALERT  Critical Value:  Lactic Acid 2.7  Date & Time Notied:  07/12/17 0027  Provider Notified: Fredricka Bonineonnor on call for Trauma  Orders Received/Actions taken: No actions taken lactic acid is improving
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
## 2017-07-12 NOTE — Progress Notes (Signed)
CRITICAL VALUE ALERT  Critical Value:  Lactic acid 3.1  Date & Time Notied:  1224 07/12/17  Provider Notified:  Duwayne HeckBrooke Meuthe PA   Orders Received/Actions taken: no orders at this time.

## 2017-07-12 NOTE — Progress Notes (Signed)
Subjective: 1 Day Post-Op Procedure(s) (LRB): IRRIGATION AND DEBRIDEMENT OPEN FRACTURE RIGHT ANKLE OPEN REDUCTION INTERNAL FIXATION (Right) EXTERNAL FIXATION RIGHT TIBIAL PLATEAU (Right)  Patient reports pain as mild to moderate.  Denies fever, chills, N/V, CP, SOB.  Tolerating POs well.  Admits to flatus.  Objective:   VITALS:  Temp:  [98.4 F (36.9 C)-99.1 F (37.3 C)] 99.1 F (37.3 C) (06/20 0755) Pulse Rate:  [70-104] 79 (06/20 0755) Resp:  [13-28] 17 (06/20 0755) BP: (92-142)/(58-98) 120/84 (06/20 0755) SpO2:  [84 %-100 %] 96 % (06/20 0755) FiO2 (%):  [32 %] 32 % (06/19 1637) Weight:  [100.7 kg (222 lb)] 100.7 kg (222 lb) (06/19 1832)  General: WDWN patient in NAD. Psych:  Appropriate mood and affect. Neuro:  A&O x 3, Moving all extremities, sensation intact to light touch HEENT:  EOMs intact Chest:  Even non-labored respirations Skin:  Dressing and ex-fix C/D/I, no rashes or lesions Extremities: warm/dry, mild edema, no visible erythema or echymosis.  No lymphadenopathy. Pulses: Femora 2+ MSK:  ROM: EHL/FHL intact, MMT: able to perform quad set    LABS Recent Labs    07/11/17 1500 07/11/17 1511 07/12/17 0010  HGB 14.6 14.3 13.6  WBC 18.4*  --  18.4*  PLT 413*  --  316   Recent Labs    07/11/17 1500 07/11/17 1511 07/12/17 0010  NA 138 139 139  K 3.3* 3.5 4.1  CL 107 106 107  CO2 17*  --  25  BUN 9 8 6   CREATININE 1.37* 1.20 1.19  GLUCOSE 138* 133* 148*   Recent Labs    07/11/17 1500  INR 0.97     Assessment/Plan: 1 Day Post-Op Procedure(s) (LRB): IRRIGATION AND DEBRIDEMENT OPEN FRACTURE RIGHT ANKLE OPEN REDUCTION INTERNAL FIXATION (Right) EXTERNAL FIXATION RIGHT TIBIAL PLATEAU (Right)  NWB R LE Up with therapy Dr. Jena GaussHaddix to take to OR next week for ex-fix removal and ORIF R tibial plateau fx Post operative care between Dr. Jena GaussHaddix and Dr. Victorino DikeHewitt TBD.  Alfredo MartinezJustin Eriq Hufford PA-C [facility] Office:  [phone]

## 2017-07-13 ENCOUNTER — Encounter (HOSPITAL_COMMUNITY): Payer: Self-pay | Admitting: Orthopedic Surgery

## 2017-07-13 LAB — BASIC METABOLIC PANEL
Anion gap: 5 (ref 5–15)
BUN: 8 mg/dL (ref 6–20)
CO2: 27 mmol/L (ref 22–32)
Calcium: 8.2 mg/dL — ABNORMAL LOW (ref 8.9–10.3)
Chloride: 108 mmol/L (ref 101–111)
Creatinine, Ser: 1.16 mg/dL (ref 0.61–1.24)
GFR calc Af Amer: >60 mL/min (ref >60)
GFR calc non Af Amer: >60 mL/min (ref >60)
Glucose, Bld: 106 mg/dL — ABNORMAL HIGH (ref 65–99)
Potassium: 3.5 mmol/L (ref 3.5–5.1)
Sodium: 140 mmol/L (ref 135–145)

## 2017-07-13 LAB — LACTIC ACID, PLASMA: Lactic Acid, Venous: 0.8 mmol/L (ref 0.5–1.9)

## 2017-07-13 LAB — CBC
HCT: 32.3 % — ABNORMAL LOW (ref 39.0–52.0)
Hemoglobin: 10.9 g/dL — ABNORMAL LOW (ref 13.0–17.0)
MCH: 29.9 pg (ref 26.0–34.0)
MCHC: 33.7 g/dL (ref 30.0–36.0)
MCV: 88.5 fL (ref 78.0–100.0)
PLATELETS: 254 10*3/uL (ref 150–400)
RBC: 3.65 MIL/uL — ABNORMAL LOW (ref 4.22–5.81)
RDW: 12.9 % (ref 11.5–15.5)
WBC: 13 10*3/uL — ABNORMAL HIGH (ref 4.0–10.5)

## 2017-07-13 MED ORDER — SODIUM CHLORIDE 0.9 % IV SOLN
INTRAVENOUS | Status: DC | PRN
  Administered 2017-07-14 – 2017-07-16 (×2): via INTRAVENOUS

## 2017-07-13 MED ORDER — LEVETIRACETAM 750 MG PO TABS
1500.0000 mg | ORAL_TABLET | Freq: Two times a day (BID) | ORAL | Status: DC
Start: 2017-07-13 — End: 2017-07-18
  Administered 2017-07-13 – 2017-07-18 (×8): 1500 mg via ORAL
  Filled 2017-07-13 (×9): qty 2
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
## 2017-07-13 NOTE — Progress Notes (Signed)
Spiritual Care Consult for Advanced Directive Education acknowledged. Patient with identified HCPOA girlfriend -Chase Morris- at bedside received Advanced Directive Education. The forms are complete and the Witnessing and Notarization are in process.  Mr. Chase Morris will be in the hospital for the next week.  Pt. Has concerns about some members of his family of origin not respecting boundaries.   It is his hope that the completion of this document will clearly communicate his confidence in Chase Morris. Pt. Being of sound mind has identified Chase Morris as the person whom he is most comfortable with being present for medical briefings, partnering with him on medical decisions, and in the event he is unable making decisions on his behalf.
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
## 2017-07-14 MED ORDER — ACETAMINOPHEN 500 MG PO TABS
1000.0000 mg | ORAL_TABLET | Freq: Three times a day (TID) | ORAL | Status: DC
  Administered 2017-07-14 – 2017-07-15 (×4): 1000 mg via ORAL
  Administered 2017-07-15: 500 mg via ORAL
  Administered 2017-07-17 – 2017-07-18 (×5): 1000 mg via ORAL
  Filled 2017-07-14 (×10): qty 2
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
## 2017-07-15 LAB — COMPREHENSIVE METABOLIC PANEL
ALBUMIN: 3 g/dL — AB (ref 3.5–5.0)
ALT: 24 U/L (ref 17–63)
AST: 60 U/L — AB (ref 15–41)
Alkaline Phosphatase: 66 U/L (ref 38–126)
Anion gap: 8 (ref 5–15)
BILIRUBIN TOTAL: 0.9 mg/dL (ref 0.3–1.2)
BUN: 10 mg/dL (ref 6–20)
CHLORIDE: 100 mmol/L — AB (ref 101–111)
CO2: 29 mmol/L (ref 22–32)
Calcium: 8.9 mg/dL (ref 8.9–10.3)
Creatinine, Ser: 1.15 mg/dL (ref 0.61–1.24)
GFR calc Af Amer: >60 mL/min (ref >60)
GFR calc non Af Amer: >60 mL/min (ref >60)
GLUCOSE: 104 mg/dL — AB (ref 65–99)
POTASSIUM: 3.8 mmol/L (ref 3.5–5.1)
SODIUM: 137 mmol/L (ref 135–145)
TOTAL PROTEIN: 6.5 g/dL (ref 6.5–8.1)

## 2017-07-15 LAB — CBC
HEMATOCRIT: 35.1 % — AB (ref 39.0–52.0)
Hemoglobin: 11.5 g/dL — ABNORMAL LOW (ref 13.0–17.0)
MCH: 29.5 pg (ref 26.0–34.0)
MCHC: 32.8 g/dL (ref 30.0–36.0)
MCV: 90 fL (ref 78.0–100.0)
Platelets: 326 10*3/uL (ref 150–400)
RBC: 3.9 MIL/uL — ABNORMAL LOW (ref 4.22–5.81)
RDW: 12.9 % (ref 11.5–15.5)
WBC: 13.3 10*3/uL — AB (ref 4.0–10.5)

## 2017-07-15 MED ORDER — MAGNESIUM CITRATE PO SOLN
1.0000 | Freq: Once | ORAL | Status: AC
  Administered 2017-07-15: 1 via ORAL
  Filled 2017-07-15: qty 296

## 2017-07-15 MED ORDER — LACTATED RINGERS IV SOLN
INTRAVENOUS | Status: DC
  Administered 2017-07-16 (×3): via INTRAVENOUS
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
## 2017-07-16 ENCOUNTER — Encounter (HOSPITAL_COMMUNITY): Admission: EM | Disposition: A | Discharge status: Home Health | Payer: Self-pay | Source: Home / Self Care

## 2017-07-16 ENCOUNTER — Inpatient Hospital Stay (HOSPITAL_COMMUNITY): Payer: Medicare Other

## 2017-07-16 ENCOUNTER — Inpatient Hospital Stay (HOSPITAL_COMMUNITY): Payer: Medicare Other | Admitting: Certified Registered Nurse Anesthetist

## 2017-07-16 ENCOUNTER — Encounter (HOSPITAL_COMMUNITY): Payer: Self-pay | Admitting: Certified Registered Nurse Anesthetist

## 2017-07-16 DIAGNOSIS — S82891C Other fracture of right lower leg, initial encounter for open fracture type IIIA, IIIB, or IIIC: Secondary | ICD-10-CM

## 2017-07-16 DIAGNOSIS — S82141A Displaced bicondylar fracture of right tibia, initial encounter for closed fracture: Secondary | ICD-10-CM

## 2017-07-16 DIAGNOSIS — R569 Unspecified convulsions: Secondary | ICD-10-CM

## 2017-07-16 HISTORY — PX: EXTERNAL FIXATION REMOVAL: SHX5040

## 2017-07-16 HISTORY — PX: ORIF TIBIA PLATEAU: SHX2132

## 2017-07-16 SURGERY — OPEN REDUCTION INTERNAL FIXATION (ORIF) TIBIAL PLATEAU
Anesthesia: General | Site: Leg Lower | Laterality: Right

## 2017-07-16 MED ORDER — DEXAMETHASONE SODIUM PHOSPHATE 10 MG/ML IJ SOLN
INTRAMUSCULAR | Status: DC | PRN
  Administered 2017-07-16: 10 mg via INTRAVENOUS

## 2017-07-16 MED ORDER — ONDANSETRON HCL 4 MG/2ML IJ SOLN
INTRAMUSCULAR | Status: DC | PRN
  Administered 2017-07-16: 4 mg via INTRAVENOUS

## 2017-07-16 MED ORDER — ROCURONIUM BROMIDE 10 MG/ML (PF) SYRINGE
PREFILLED_SYRINGE | INTRAVENOUS | Status: DC | PRN
  Administered 2017-07-16: 50 mg via INTRAVENOUS

## 2017-07-16 MED ORDER — LIDOCAINE 2% (20 MG/ML) 5 ML SYRINGE
INTRAMUSCULAR | Status: AC
Start: 2017-07-16 — End: ?
  Filled 2017-07-16: qty 5

## 2017-07-16 MED ORDER — 0.9 % SODIUM CHLORIDE (POUR BTL) OPTIME
TOPICAL | Status: DC | PRN
  Administered 2017-07-16: 1000 mL

## 2017-07-16 MED ORDER — CEFAZOLIN SODIUM-DEXTROSE 2-4 GM/100ML-% IV SOLN: 2.0000 g | INTRAVENOUS | Status: DC

## 2017-07-16 MED ORDER — FENTANYL CITRATE (PF) 250 MCG/5ML IJ SOLN
INTRAMUSCULAR | Status: AC
  Filled 2017-07-16: qty 5

## 2017-07-16 MED ORDER — SODIUM CHLORIDE 0.9 % IJ SOLN
INTRAMUSCULAR | Status: AC
Start: 2017-07-16 — End: ?
  Filled 2017-07-16: qty 10

## 2017-07-16 MED ORDER — OXYCODONE HCL 5 MG PO TABS: 5.0000 mg | ORAL_TABLET | Freq: Once | ORAL | Status: DC | PRN

## 2017-07-16 MED ORDER — LACTATED RINGERS IV SOLN
INTRAVENOUS | Status: DC | PRN
  Administered 2017-07-16: 07:00:00 via INTRAVENOUS

## 2017-07-16 MED ORDER — BACITRACIN 500 UNIT/GM EX OINT
TOPICAL_OINTMENT | CUTANEOUS | Status: DC | PRN
  Administered 2017-07-16: 1 via TOPICAL

## 2017-07-16 MED ORDER — PROPOFOL 10 MG/ML IV BOLUS
INTRAVENOUS | Status: DC | PRN
  Administered 2017-07-16: 130 mg via INTRAVENOUS

## 2017-07-16 MED ORDER — VANCOMYCIN HCL 1000 MG IV SOLR
INTRAVENOUS | Status: DC | PRN
  Administered 2017-07-16: 1000 mg

## 2017-07-16 MED ORDER — OXYCODONE HCL 5 MG/5ML PO SOLN: 5.0000 mg | Freq: Once | ORAL | Status: DC | PRN

## 2017-07-16 MED ORDER — FENTANYL CITRATE (PF) 100 MCG/2ML IJ SOLN
INTRAMUSCULAR | Status: DC | PRN
  Administered 2017-07-16 (×8): 50 ug via INTRAVENOUS

## 2017-07-16 MED ORDER — ONDANSETRON HCL 4 MG/2ML IJ SOLN
INTRAMUSCULAR | Status: AC
  Filled 2017-07-16: qty 2

## 2017-07-16 MED ORDER — CEFAZOLIN SODIUM-DEXTROSE 1-4 GM/50ML-% IV SOLN
INTRAVENOUS | Status: DC | PRN
  Administered 2017-07-16: 1 g via INTRAVENOUS

## 2017-07-16 MED ORDER — LIDOCAINE 2% (20 MG/ML) 5 ML SYRINGE
INTRAMUSCULAR | Status: DC | PRN
  Administered 2017-07-16: 60 mg via INTRAVENOUS

## 2017-07-16 MED ORDER — EPHEDRINE SULFATE 50 MG/ML IJ SOLN
INTRAMUSCULAR | Status: AC
Start: 2017-07-16 — End: ?
  Filled 2017-07-16: qty 1

## 2017-07-16 MED ORDER — BACITRACIN ZINC 500 UNIT/GM EX OINT
TOPICAL_OINTMENT | CUTANEOUS | Status: AC
  Filled 2017-07-16: qty 28.35

## 2017-07-16 MED ORDER — CEFAZOLIN SODIUM 1 G IJ SOLR
INTRAMUSCULAR | Status: AC
  Filled 2017-07-16: qty 10

## 2017-07-16 MED ORDER — LACTATED RINGERS IV SOLN
INTRAVENOUS | Status: DC | PRN
  Administered 2017-07-16 (×2): via INTRAVENOUS

## 2017-07-16 MED ORDER — DEXAMETHASONE SODIUM PHOSPHATE 10 MG/ML IJ SOLN
INTRAMUSCULAR | Status: AC
  Filled 2017-07-16: qty 1

## 2017-07-16 MED ORDER — MIDAZOLAM HCL 2 MG/2ML IJ SOLN
INTRAMUSCULAR | Status: AC
  Filled 2017-07-16: qty 2

## 2017-07-16 MED ORDER — ESMOLOL HCL 100 MG/10ML IV SOLN
INTRAVENOUS | Status: DC | PRN
  Administered 2017-07-16: 30 mg via INTRAVENOUS

## 2017-07-16 MED ORDER — SUGAMMADEX SODIUM 200 MG/2ML IV SOLN
INTRAVENOUS | Status: DC | PRN
  Administered 2017-07-16: 201.4 mg via INTRAVENOUS

## 2017-07-16 MED ORDER — HYDROMORPHONE HCL 1 MG/ML IJ SOLN: 0.2500 mg | INTRAMUSCULAR | Status: DC | PRN

## 2017-07-16 MED ORDER — SUGAMMADEX SODIUM 200 MG/2ML IV SOLN
INTRAVENOUS | Status: AC
  Filled 2017-07-16: qty 2

## 2017-07-16 MED ORDER — PROPOFOL 10 MG/ML IV BOLUS
INTRAVENOUS | Status: AC
  Filled 2017-07-16: qty 40

## 2017-07-16 MED ORDER — CEFAZOLIN SODIUM-DEXTROSE 2-4 GM/100ML-% IV SOLN
2.0000 g | Freq: Three times a day (TID) | INTRAVENOUS | Status: AC
  Administered 2017-07-16 – 2017-07-17 (×3): 2 g via INTRAVENOUS
  Filled 2017-07-16 (×3): qty 100

## 2017-07-16 MED ORDER — SODIUM CHLORIDE 0.9 % IJ SOLN
INTRAMUSCULAR | Status: AC
  Filled 2017-07-16: qty 10

## 2017-07-16 MED ORDER — VECURONIUM BROMIDE 10 MG IV SOLR
INTRAVENOUS | Status: AC
  Filled 2017-07-16: qty 10

## 2017-07-16 MED ORDER — VANCOMYCIN HCL 1000 MG IV SOLR
INTRAVENOUS | Status: AC
  Filled 2017-07-16: qty 1000

## 2017-07-16 MED ORDER — VECURONIUM BROMIDE 10 MG IV SOLR
INTRAVENOUS | Status: DC | PRN
  Administered 2017-07-16 (×2): 2 mg via INTRAVENOUS
  Administered 2017-07-16 (×2): 3 mg via INTRAVENOUS

## 2017-07-16 MED ORDER — MIDAZOLAM HCL 5 MG/5ML IJ SOLN
INTRAMUSCULAR | Status: DC | PRN
  Administered 2017-07-16: 1 mg via INTRAVENOUS

## 2017-07-16 SURGICAL SUPPLY — 93 items
BANDAGE ACE 4X5 VEL STRL LF (GAUZE/BANDAGES/DRESSINGS) ×3 IMPLANT
BANDAGE ACE 6X5 VEL STRL LF (GAUZE/BANDAGES/DRESSINGS) ×3 IMPLANT
BANDAGE ESMARK 6X9 LF (GAUZE/BANDAGES/DRESSINGS) ×1 IMPLANT
BIT DRILL CALIBR QC 2.5X250 (BIT) ×2 IMPLANT
BIT DRILL CALIBR QC 2.8X250 (BIT) ×2 IMPLANT
BIT DRILL PERC QC 2.8X200 100 (BIT) IMPLANT
BIT DRILL QC 3.5X110 (BIT) ×2 IMPLANT
BLADE CLIPPER SURG (BLADE) ×2 IMPLANT
BLADE SURG 15 STRL LF DISP TIS (BLADE) ×1 IMPLANT
BLADE SURG 15 STRL SS (BLADE)
BNDG ESMARK 6X9 LF (GAUZE/BANDAGES/DRESSINGS) ×3
BNDG GAUZE ELAST 4 BULKY (GAUZE/BANDAGES/DRESSINGS) ×4 IMPLANT
BRUSH SCRUB SURG 4.25 DISP (MISCELLANEOUS) ×6 IMPLANT
CANISTER SUCT 3000ML PPV (MISCELLANEOUS) ×3 IMPLANT
CHLORAPREP W/TINT 26ML (MISCELLANEOUS) ×8 IMPLANT
COVER SURGICAL LIGHT HANDLE (MISCELLANEOUS) ×4 IMPLANT
CUFF TOURNIQUET SINGLE 34IN LL (TOURNIQUET CUFF) ×3 IMPLANT
DRAPE C-ARM 42X72 X-RAY (DRAPES) ×3 IMPLANT
DRAPE C-ARMOR (DRAPES) ×3 IMPLANT
DRAPE ORTHO SPLIT 77X108 STRL (DRAPES) ×4
DRAPE SURG ORHT 6 SPLT 77X108 (DRAPES) ×2 IMPLANT
DRAPE U-SHAPE 47X51 STRL (DRAPES) ×1 IMPLANT
DRILL BIT QUICK COUP 2.8MM 100 (BIT) ×2
DRSG ADAPTIC 3X8 NADH LF (GAUZE/BANDAGES/DRESSINGS) ×1 IMPLANT
DRSG PAD ABDOMINAL 8X10 ST (GAUZE/BANDAGES/DRESSINGS) ×4 IMPLANT
ELECT REM PT RETURN 9FT ADLT (ELECTROSURGICAL) ×3
ELECTRODE REM PT RTRN 9FT ADLT (ELECTROSURGICAL) ×1 IMPLANT
GAUZE SPONGE 4X4 12PLY STRL (GAUZE/BANDAGES/DRESSINGS) ×7 IMPLANT
GLOVE BIO SURGEON STRL SZ 6.5 (GLOVE) ×2 IMPLANT
GLOVE BIO SURGEON STRL SZ7 (GLOVE) ×6 IMPLANT
GLOVE BIO SURGEON STRL SZ7.5 (GLOVE) ×12 IMPLANT
GLOVE BIO SURGEONS STRL SZ 6.5 (GLOVE) ×2
GLOVE BIOGEL PI IND STRL 6.5 (GLOVE) IMPLANT
GLOVE BIOGEL PI IND STRL 7.5 (GLOVE) ×1 IMPLANT
GLOVE BIOGEL PI INDICATOR 6.5 (GLOVE) ×4
GLOVE BIOGEL PI INDICATOR 7.5 (GLOVE) ×6
GLOVE SURG SS PI 6.5 STRL IVOR (GLOVE) ×8 IMPLANT
GOWN STRL REUS W/ TWL LRG LVL3 (GOWN DISPOSABLE) ×2 IMPLANT
GOWN STRL REUS W/TWL LRG LVL3 (GOWN DISPOSABLE) ×10
IMMOBILIZER KNEE 22 UNIV (SOFTGOODS) ×3 IMPLANT
K-WIRE 1.6X150 (WIRE) ×3
K-WIRE 2.0X150M (WIRE) ×3
KIT BASIN OR (CUSTOM PROCEDURE TRAY) ×3 IMPLANT
KIT TURNOVER KIT B (KITS) ×3 IMPLANT
KWIRE 1.6X150 (WIRE) IMPLANT
KWIRE 2.0X150M (WIRE) IMPLANT
MANIFOLD NEPTUNE II (INSTRUMENTS) ×1 IMPLANT
NDL SUT 6 .5 CRC .975X.05 MAYO (NEEDLE) ×1 IMPLANT
NEEDLE MAYO TAPER (NEEDLE) ×2
NS IRRIG 1000ML POUR BTL (IV SOLUTION) ×3 IMPLANT
PACK TOTAL JOINT (CUSTOM PROCEDURE TRAY) ×3 IMPLANT
PAD ARMBOARD 7.5X6 YLW CONV (MISCELLANEOUS) ×4 IMPLANT
PAD CAST 4YDX4 CTTN HI CHSV (CAST SUPPLIES) ×1 IMPLANT
PADDING CAST COTTON 4X4 STRL (CAST SUPPLIES) ×2
PADDING CAST COTTON 6X4 STRL (CAST SUPPLIES) ×5 IMPLANT
PLATE TIBIA VA-LCP 6H RT (Plate) ×2 IMPLANT
PROS LCP PLATE 6H 85MM (Plate) ×3 IMPLANT
PROSTHESIS LCP PLATE 6H 85MM (Plate) IMPLANT
SCREW CORT HEADED ST 3.5X44 (Screw) ×2 IMPLANT
SCREW HEADED ST 3.5X34 (Screw) ×2 IMPLANT
SCREW HEADED ST 3.5X36 (Screw) ×4 IMPLANT
SCREW HEADED ST 3.5X52 (Screw) ×2 IMPLANT
SCREW HEADED ST 3.5X80 (Screw) ×2 IMPLANT
SCREW HEADED ST 3.5X90 (Screw) ×2 IMPLANT
SCREW LOCKING 3.5X70MM VA (Screw) ×2 IMPLANT
SCREW LOCKING VA 3.5X28MM (Screw) ×2 IMPLANT
SCREW LOCKING VA 3.5X85MM (Screw) ×4 IMPLANT
SCREW VA-LOCKING 65MM 3.5 (Screw) ×2 IMPLANT
SPONGE LAP 18X18 X RAY DECT (DISPOSABLE) ×1 IMPLANT
STAPLER VISISTAT 35W (STAPLE) ×1 IMPLANT
SUCTION FRAZIER HANDLE 10FR (MISCELLANEOUS) ×2
SUCTION TUBE FRAZIER 10FR DISP (MISCELLANEOUS) ×1 IMPLANT
SUT ETHILON 2 0 FS 18 (SUTURE) ×1 IMPLANT
SUT ETHILON 3 0 PS 1 (SUTURE) ×8 IMPLANT
SUT FIBERWIRE #2 38 T-5 BLUE (SUTURE)
SUT MNCRL AB 3-0 PS2 18 (SUTURE) ×1 IMPLANT
SUT MON AB 2-0 CT1 36 (SUTURE) ×1 IMPLANT
SUT VIC AB 0 CT1 18XCR BRD 8 (SUTURE) IMPLANT
SUT VIC AB 0 CT1 27 (SUTURE) ×2
SUT VIC AB 0 CT1 27XBRD ANBCTR (SUTURE) IMPLANT
SUT VIC AB 0 CT1 8-18 (SUTURE) ×2
SUT VIC AB 1 CT1 18XCR BRD 8 (SUTURE) IMPLANT
SUT VIC AB 1 CT1 27 (SUTURE) ×4
SUT VIC AB 1 CT1 27XBRD ANBCTR (SUTURE) ×1 IMPLANT
SUT VIC AB 1 CT1 8-18 (SUTURE)
SUT VIC AB 2-0 CT1 27 (SUTURE) ×6
SUT VIC AB 2-0 CT1 TAPERPNT 27 (SUTURE) ×2 IMPLANT
SUTURE FIBERWR #2 38 T-5 BLUE (SUTURE) IMPLANT
TOWEL OR 17X24 6PK STRL BLUE (TOWEL DISPOSABLE) ×2 IMPLANT
TOWEL OR 17X26 10 PK STRL BLUE (TOWEL DISPOSABLE) ×4 IMPLANT
TRAY FOLEY MTR SLVR 16FR STAT (SET/KITS/TRAYS/PACK) ×2 IMPLANT
UNDERPAD 30X30 (UNDERPADS AND DIAPERS) ×3 IMPLANT
WATER STERILE IRR 1000ML POUR (IV SOLUTION) ×2 IMPLANT

## 2017-07-16 NOTE — Transfer of Care (Signed)
Immediate Anesthesia Transfer of Care Note  Patient: Chase Morris  Procedure(s) Performed: OPEN REDUCTION INTERNAL FIXATION TIBIAL PLATEAU (Right Leg Lower) REMOVAL EXTERNAL FIXATION LEG (Right Leg Lower)  Patient Location: PACU  Anesthesia Type:General  Level of Consciousness: drowsy and patient cooperative  Airway & Oxygen Therapy: Patient Spontanous Breathing and Patient connected to face mask oxygen  Post-op Assessment: Report given to RN and Post -op Vital signs reviewed and stable  Post vital signs: Reviewed and stable  Last Vitals:  Vitals Value Taken Time  BP 138/89 07/16/2017 11:17 AM  Temp 36.6 C 07/16/2017 11:16 AM  Pulse 107 07/16/2017 11:20 AM  Resp 16 07/16/2017 11:20 AM  SpO2 94 % 07/16/2017 11:20 AM  Vitals shown include unvalidated device data.  Last Pain:  Vitals:   07/16/17 1116  TempSrc:   PainSc: Asleep      Patients Stated Pain Goal: 2 (07/15/17 1901)  Complications: No apparent anesthesia complications
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
## 2017-07-16 NOTE — Anesthesia Procedure Notes (Signed)
Procedure Name: Intubation Date/Time: 07/16/2017 7:44 AM Performed by: Colin Benton, CRNA Pre-anesthesia Checklist: Patient identified, Emergency Drugs available, Suction available and Patient being monitored Patient Re-evaluated:Patient Re-evaluated prior to induction Oxygen Delivery Method: Circle system utilized Preoxygenation: Pre-oxygenation with 100% oxygen Induction Type: IV induction Ventilation: Mask ventilation without difficulty and Oral airway inserted - appropriate to patient size Laryngoscope Size: Mac and 4 Grade View: Grade II Tube type: Oral Tube size: 8.0 mm Number of attempts: 1 Airway Equipment and Method: Stylet Placement Confirmation: ETT inserted through vocal cords under direct vision,  positive ETCO2 and breath sounds checked- equal and bilateral Secured at: 24 cm Tube secured with: Tape Dental Injury: Teeth and Oropharynx as per pre-operative assessment
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
## 2017-07-16 NOTE — Progress Notes (Signed)
Orthopedic Tech Progress Note Patient Details:  Chase Morris [birth year] [id number]  Patient ID: Chase Morris, male   DOB: [birth year], 42 y.o.   MRN: [id number]   Saul FordyceJennifer C Chayla Shands 07/16/2017, 1:17 PMCalled [employer] for right hinged knee brace .
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
## 2017-07-17 ENCOUNTER — Encounter (HOSPITAL_COMMUNITY): Payer: Self-pay | Admitting: Student

## 2017-07-17 LAB — CBC
HEMATOCRIT: 38 % — AB (ref 39.0–52.0)
HEMOGLOBIN: 12.7 g/dL — AB (ref 13.0–17.0)
MCH: 29.6 pg (ref 26.0–34.0)
MCHC: 33.4 g/dL (ref 30.0–36.0)
MCV: 88.6 fL (ref 78.0–100.0)
Platelets: 428 10*3/uL — ABNORMAL HIGH (ref 150–400)
RBC: 4.29 MIL/uL (ref 4.22–5.81)
RDW: 12.5 % (ref 11.5–15.5)
WBC: 16.3 10*3/uL — AB (ref 4.0–10.5)
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
## 2017-07-17 NOTE — Progress Notes (Signed)
1900: Handoff report received from RN. Pt resting in bed. Discussed plan of care for the shift; pt amenable to plan.  0000: Pt resting comfortably.  0400: Pt continues resting comfortably.  0700: Handoff report given to RN. No acute events overnight.  
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
## 2017-07-18 LAB — CBC
HCT: 36.5 % — ABNORMAL LOW (ref 39.0–52.0)
Hemoglobin: 11.9 g/dL — ABNORMAL LOW (ref 13.0–17.0)
MCH: 29.2 pg (ref 26.0–34.0)
MCHC: 32.6 g/dL (ref 30.0–36.0)
MCV: 89.7 fL (ref 78.0–100.0)
Platelets: 441 10*3/uL — ABNORMAL HIGH (ref 150–400)
RBC: 4.07 MIL/uL — ABNORMAL LOW (ref 4.22–5.81)
RDW: 12.6 % (ref 11.5–15.5)
WBC: 15.5 10*3/uL — ABNORMAL HIGH (ref 4.0–10.5)

## 2017-07-18 MED ORDER — LEVETIRACETAM 500 MG PO TABS: 1500.0000 mg | ORAL_TABLET | Freq: Two times a day (BID) | ORAL | 2 refills | Status: AC

## 2017-07-18 MED ORDER — METHOCARBAMOL 500 MG PO TABS: 500.0000 mg | ORAL_TABLET | Freq: Three times a day (TID) | ORAL | 0 refills | Status: AC | PRN

## 2017-07-18 MED ORDER — AMITRIPTYLINE HCL 75 MG PO TABS: 75.0000 mg | ORAL_TABLET | Freq: Every day | ORAL | 2 refills | Status: AC

## 2017-07-18 MED ORDER — ENOXAPARIN SODIUM 40 MG/0.4ML ~~LOC~~ SOLN: 40.0000 mg | SUBCUTANEOUS | 0 refills | Status: AC

## 2017-07-18 MED ORDER — LAMOTRIGINE 150 MG PO TABS: 300.0000 mg | ORAL_TABLET | Freq: Two times a day (BID) | ORAL | 2 refills | Status: AC

## 2017-07-18 MED ORDER — MAGNESIUM CITRATE PO SOLN
1.0000 | Freq: Once | ORAL | Status: AC
  Administered 2017-07-18: 1 via ORAL
  Filled 2017-07-18: qty 296

## 2017-07-18 MED ORDER — TAB-A-VITE/IRON PO TABS: 1.0000 | ORAL_TABLET | Freq: Every day | ORAL | 0 refills | Status: AC

## 2017-07-18 MED ORDER — DOCUSATE SODIUM 100 MG PO CAPS: 100.0000 mg | ORAL_CAPSULE | Freq: Two times a day (BID) | ORAL | 0 refills | Status: AC

## 2017-07-18 MED ORDER — OXYCODONE HCL 5 MG PO TABS: 5.0000 mg | ORAL_TABLET | Freq: Four times a day (QID) | ORAL | 0 refills | Status: AC | PRN

## 2017-07-18 MED ORDER — TAB-A-VITE/IRON PO TABS
1.0000 | ORAL_TABLET | Freq: Every day | ORAL | Status: DC
  Administered 2017-07-18: 1 via ORAL
  Filled 2017-07-18: qty 1
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
## 2017-07-18 NOTE — Progress Notes (Signed)
   07/18/17 1400  Clinical Encounter Type  Visited With Patient;Patient and family together  Visit Type Initial;Follow-up  Referral From Nurse  Consult/Referral To Chaplain  Spiritual Encounters  Spiritual Needs Brochure;Emotional  Stress Factors  Patient Stress Factors Exhausted  Family Stress Factors Exhausted    This was a call to complete the pt's POA papers that were started by the previous chaplain. Paperwork completed and copies made.  Terrianne Cavness a Water quality scientistMusiko-Holley, E. I. du PontChaplain
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
## 2017-07-18 NOTE — Progress Notes (Signed)
Orthopedic Tech Progress Note Patient Details:  Tyler PitaWilliam S XXXCasey 30-Jun-1975 [id number]  Ortho Devices Type of Ortho Device: ASO Ortho Device/Splint Location: lle Ortho Device/Splint Interventions: Application   Post Interventions Patient Tolerated: Well Instructions Provided: Care of device   Nikki DomCrawford, Melvin Marmo 07/18/2017, 9:38 AM

## 2017-07-18 NOTE — Discharge Instructions (Signed)
[place], patients with seizures are not allowed to drive until they have been seizure-free for six months. Use caution when using heavy equipment or power tools. Avoid working on ladders or at heights. Take showers instead of baths. Ensure the water temperature is not too high on the home water heater. Do not go swimming alone. Do not lock yourself in a room alone (i.e. bathroom). When caring for infants or small children, sit down when holding, feeding, or changing them to minimize risk of injury to the child in the event you have a seizure. Maintain good sleep hygiene. Avoid alcohol.    If Chase Morris has another seizure, call 911 and bring them back to the ED if: A. The seizure lasts longer than 5 minutes.  B. The patient doesn't wake shortly after the seizure or has new problems such as difficulty seeing, speaking or moving following the seizure C. The patient was injured during the seizure D. The patient has a temperature over 102 F (39C) E. The patient vomited during the seizure and now is having trouble breathing   Orthopaedic Trauma Service Discharge Instructions   General Discharge Instructions  WEIGHT BEARING STATUS: Nonweightbearing to right leg  RANGE OF MOTION/ACTIVITY:Keep hinged knee brace locked in extension at night and may unlock during the day to work on range of motion  Wound Care: Keep dressing clean, dry and intact  DVT/PE prophylaxis:Lovenox 40 mg daily  Diet: as you were eating previously.  Can use over the counter stool softeners and bowel preparations, such as Miralax, to help with bowel movements.  Narcotics can be constipating.  Be sure to drink plenty of fluids  PAIN MEDICATION USE AND EXPECTATIONS  You have likely been given narcotic medications to help control your pain.  After a traumatic event that results in an fracture (broken bone) with or without surgery, it is ok to use narcotic pain  medications to help control one's pain.  We understand that everyone responds to pain differently and each individual patient will be evaluated on a regular basis for the continued need for narcotic medications. Ideally, narcotic medication use should last no more than 6-8 weeks (coinciding with fracture healing).   As a patient it is your responsibility as well to monitor narcotic medication use and report the amount and frequency you use these medications when you come to your office visit.   We would also advise that if you are using narcotic medications, you should take a dose prior to therapy to maximize you participation.  IF YOU ARE ON NARCOTIC MEDICATIONS IT IS NOT PERMISSIBLE TO OPERATE A MOTOR VEHICLE (MOTORCYCLE/CAR/TRUCK/MOPED) OR HEAVY MACHINERY DO NOT MIX NARCOTICS WITH OTHER CNS (CENTRAL NERVOUS SYSTEM) DEPRESSANTS SUCH AS ALCOHOL   STOP SMOKING OR USING NICOTINE PRODUCTS!!!!  As discussed nicotine severely impairs your body's ability to heal surgical and traumatic wounds but also impairs bone healing.  Wounds and bone heal by forming microscopic blood vessels (angiogenesis) and nicotine is a vasoconstrictor (essentially, shrinks blood vessels).  Therefore, if vasoconstriction occurs to these microscopic blood vessels they essentially disappear and are unable to deliver necessary nutrients to the healing tissue.  This is one modifiable factor that you can do to dramatically increase your chances of healing your injury.    (This means no smoking, no nicotine gum, patches, etc)  DO NOT USE NONSTEROIDAL ANTI-INFLAMMATORY DRUGS (NSAID'S)  Using products such as Advil (ibuprofen), Aleve (naproxen), Motrin (ibuprofen) for additional pain control during fracture healing can  delay and/or prevent the healing response.  If you would like to take over the counter (OTC) medication, Tylenol (acetaminophen) is ok.  However, some narcotic medications that are given for pain control contain acetaminophen  as well. Therefore, you should not exceed more than 4000 mg of tylenol in a day if you do not have liver disease.  Also note that there are may OTC medicines, such as cold medicines and allergy medicines that my contain tylenol as well.  If you have any questions about medications and/or interactions please ask your doctor/PA or your pharmacist.      ICE AND ELEVATE INJURED/OPERATIVE EXTREMITY  Using ice and elevating the injured extremity above your heart can help with swelling and pain control.  Icing in a pulsatile fashion, such as 20 minutes on and 20 minutes off, can be followed.    Do not place ice directly on skin. Make sure there is a barrier between to skin and the ice pack.    Using frozen items such as frozen peas works well as the conform nicely to the are that needs to be iced.  USE AN ACE WRAP OR TED HOSE FOR SWELLING CONTROL  In addition to icing and elevation, Ace wraps or TED hose are used to help limit and resolve swelling.  It is recommended to use Ace wraps or TED hose until you are informed to stop.    When using Ace Wraps start the wrapping distally (farthest away from the body) and wrap proximally (closer to the body)   Example: If you had surgery on your leg or thing and you do not have a splint on, start the ace wrap at the toes and work your way up to the thigh        If you had surgery on your upper extremity and do not have a splint on, start the ace wrap at your fingers and work your way up to the upper arm  IF YOU ARE IN A SPLINT OR CAST DO NOT REMOVE IT FOR ANY REASON   If your splint gets wet for any reason please contact the office immediately. You may shower in your splint or cast as long as you keep it dry.  This can be done by wrapping in a cast cover or garbage back (or similar)  Do Not stick any thing down your splint or cast such as pencils, money, or hangers to try and scratch yourself with.  If you feel itchy take benadryl as prescribed on the bottle for  itching  IF YOU ARE IN A CAM BOOT (BLACK BOOT)  You may remove boot periodically. Perform daily dressing changes as noted below.  Wash the liner of the boot regularly and wear a sock when wearing the boot. It is recommended that you sleep in the boot until told otherwise  CALL THE OFFICE WITH ANY QUESTIONS OR CONCERNS: [phone]
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
## 2017-07-18 NOTE — Progress Notes (Signed)
Physical Therapy Treatment Note;   [facility] which impairs their ability to perform daily activities like walkign, bathing, dressing in the home.  A walker alone will not resolve the issues with performing activities of daily living. A wheelchair will allow patient to safely perform daily activities.  The patient can self propel in the home or has a caregiver who can provide assistance.     Van ClinesHolly Nerea Morris, [place]PT  Acute Rehabilitation Services Pager [phone] Office [phone]
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
## 2017-07-20 ENCOUNTER — Encounter (HOSPITAL_COMMUNITY): Payer: Self-pay | Admitting: Student

## 2017-07-20 NOTE — Anesthesia Postprocedure Evaluation (Signed)
Anesthesia Post Note  Patient: Chase Morris  Procedure(s) Performed: OPEN REDUCTION INTERNAL FIXATION TIBIAL PLATEAU (Right Leg Lower) REMOVAL EXTERNAL FIXATION LEG (Right Leg Lower)     Patient location during evaluation: PACU Anesthesia Type: General Level of consciousness: awake and alert Pain management: pain level controlled Vital Signs Assessment: post-procedure vital signs reviewed and stable Respiratory status: spontaneous breathing, nonlabored ventilation, respiratory function stable and patient connected to nasal cannula oxygen Cardiovascular status: blood pressure returned to baseline and stable Postop Assessment: no apparent nausea or vomiting Anesthetic complications: no    Last Vitals:  Vitals:   07/18/17 0751 07/18/17 1200  BP: 99/76 109/78  Pulse: (!) 105 (!) 118  Resp: 13 20  Temp: 36.8 C 36.9 C  SpO2: 98% 98%    Last Pain:  Vitals:   07/18/17 1200  TempSrc: Oral  PainSc: 1                  Ayce Pietrzyk
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
